# Patient Record
Sex: Male | Born: 1950 | Race: Black or African American | Hispanic: No | Marital: Single | Smoking: Former smoker
Health system: Southern US, Community
[De-identification: ages and names within clinical notes are randomized; demographics above are authoritative.]

---

## 2017-06-15 ENCOUNTER — Encounter (HOSPITAL_BASED_OUTPATIENT_CLINIC_OR_DEPARTMENT_OTHER): Payer: Self-pay | Admitting: Emergency Medicine

## 2017-06-15 ENCOUNTER — Emergency Department (HOSPITAL_BASED_OUTPATIENT_CLINIC_OR_DEPARTMENT_OTHER)
Admission: EM | Admit: 2017-06-15 | Discharge: 2017-06-15 | Disposition: A | Discharge status: Home / Self Care | Payer: Medicare HMO | Attending: Emergency Medicine | Admitting: Emergency Medicine

## 2017-06-15 ENCOUNTER — Other Ambulatory Visit: Payer: Self-pay

## 2017-06-15 ENCOUNTER — Emergency Department (HOSPITAL_BASED_OUTPATIENT_CLINIC_OR_DEPARTMENT_OTHER): Payer: Medicare HMO

## 2017-06-15 DIAGNOSIS — S63681A Other sprain of right thumb, initial encounter: Secondary | ICD-10-CM | POA: Insufficient documentation

## 2017-06-15 DIAGNOSIS — Y999 Unspecified external cause status: Secondary | ICD-10-CM | POA: Diagnosis not present

## 2017-06-15 DIAGNOSIS — S6991XA Unspecified injury of right wrist, hand and finger(s), initial encounter: Secondary | ICD-10-CM | POA: Diagnosis present

## 2017-06-15 DIAGNOSIS — X500XXA Overexertion from strenuous movement or load, initial encounter: Secondary | ICD-10-CM | POA: Diagnosis not present

## 2017-06-15 DIAGNOSIS — Y929 Unspecified place or not applicable: Secondary | ICD-10-CM | POA: Diagnosis not present

## 2017-06-15 DIAGNOSIS — Y9389 Activity, other specified: Secondary | ICD-10-CM | POA: Insufficient documentation

## 2017-06-15 NOTE — ED Provider Notes (Signed)
[facility] EMERGENCY DEPARTMENT Provider Note   CSN: [id number] Arrival date & time: 06/15/17  1208     History   Chief Complaint Chief Complaint  Patient presents with  . Finger Injury    HPI Franklin Johnson is a 67 y.o. male.  67 year old right-handed male who presents with right thumb pain.  Approximately 1 month ago, he was helping a friend with a ladder and injured his right thumb.  He has continued to have pain and swelling since that time.  Thumb feels stiff in the morning, improves throughout the day.  He denies any numbness.  He reports normal functioning of his hand.   The history is provided by the patient.    History reviewed. No pertinent past medical history.  There are no active problems to display for this patient.   PMH: non-contributory PSH: non-contributory   Home Medications    Prior to Admission medications   Not on File    Family History History reviewed. No pertinent family history. Noncontributory  Social History Social History   Tobacco Use  . Smoking status: Not on file  Substance Use Topics  . Alcohol use: Not on file  . Drug use: Not on file  Non contributory   Allergies   Patient has no known allergies.   Review of Systems Review of Systems  Musculoskeletal: Positive for joint swelling.  Skin: Negative for color change.  Neurological: Negative for numbness.      Physical Exam Updated Vital Signs BP (!) 143/90 (BP Location: Left Arm)   Pulse 76   Temp 98.8 F (37.1 C) (Oral)   Resp 18   Ht 5\' 7"  (1.702 m)   Wt 79.4 kg (175 lb)   SpO2 99%   BMI 27.41 kg/m   Physical Exam  Constitutional: He is oriented to person, place, and time. He appears well-developed and well-nourished. No distress.  HENT:  Head: Normocephalic and atraumatic.  Eyes: Conjunctivae are normal.  Neck: Neck supple.  Cardiovascular: Intact distal pulses.  Musculoskeletal: He exhibits edema. He exhibits no tenderness.  Mild  edema of R thumb at MCP and IP joints, normal ROM, no joint laxity at 1st MCP joint  Neurological: He is alert and oriented to person, place, and time. No sensory deficit.  Skin: Skin is warm and dry. Capillary refill takes less than 2 seconds. No rash noted.  Psychiatric: He has a normal mood and affect. Judgment normal.  Nursing note and vitals reviewed.    ED Treatments / Results  Labs (all labs ordered are listed, but only abnormal results are displayed) Labs Reviewed - No data to display  EKG  EKG Interpretation None       Radiology Dg Finger Thumb Right  Result Date: 06/15/2017 CLINICAL DATA:  Right thumb injury 1 month ago. Mechanism unknown Initial encounter. EXAM: RIGHT THUMB 2+V COMPARISON:  None. FINDINGS: No acute bony or joint abnormality is seen. 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 Soft tissues are unremarkable. IMPRESSION: No acute abnormality. Electronically Signed   By: Drusilla Kanner M.D.   On: 06/15/2017 13:13    Procedures Procedures (including critical care time)  Medications Ordered in ED Medications - No data to display   Initial Impression / Assessment and Plan / ED Course  I have reviewed the triage vital signs and the nursing notes.  Pertinent  imaging results that were available during my care of the patient were reviewed  by me and considered in my medical decision making (see chart for details).     Plain films neg for acute fx. Suspect ligamentous sprain.  Gave Velcro thumb spica for comfort and immobilization, discussed follow-up with sports medicine if no improvement with conservative measures.  Final Clinical Impressions(s) / ED Diagnoses   Final diagnoses:  Other sprain of right thumb, initial encounter    ED Discharge Orders    None       Little, Ambrose Finlandachel Morgan, MD 06/15/17 1504

## 2017-06-15 NOTE — ED Triage Notes (Signed)
Patient reports right thumb injury x 1 month.  Reports swelling to right thumb since event occurred.

## 2019-03-08 IMAGING — CR DG FINGER THUMB 2+V*R*
4 series · 4 of 4 positions shown · non-contrast
Comparison: None.

CLINICAL DATA: Right thumb injury 1 month ago. Mechanism unknown
Initial encounter.

EXAM:
RIGHT THUMB 2+V

[x finger pa right (1 of 2)]
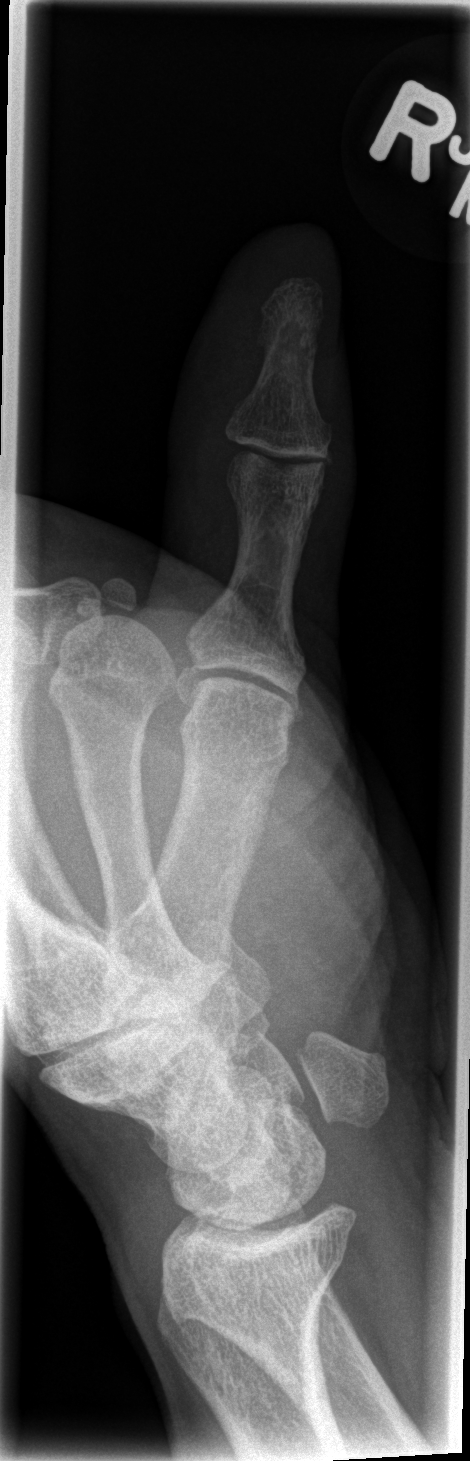

[x finger pa right (2 of 2)]
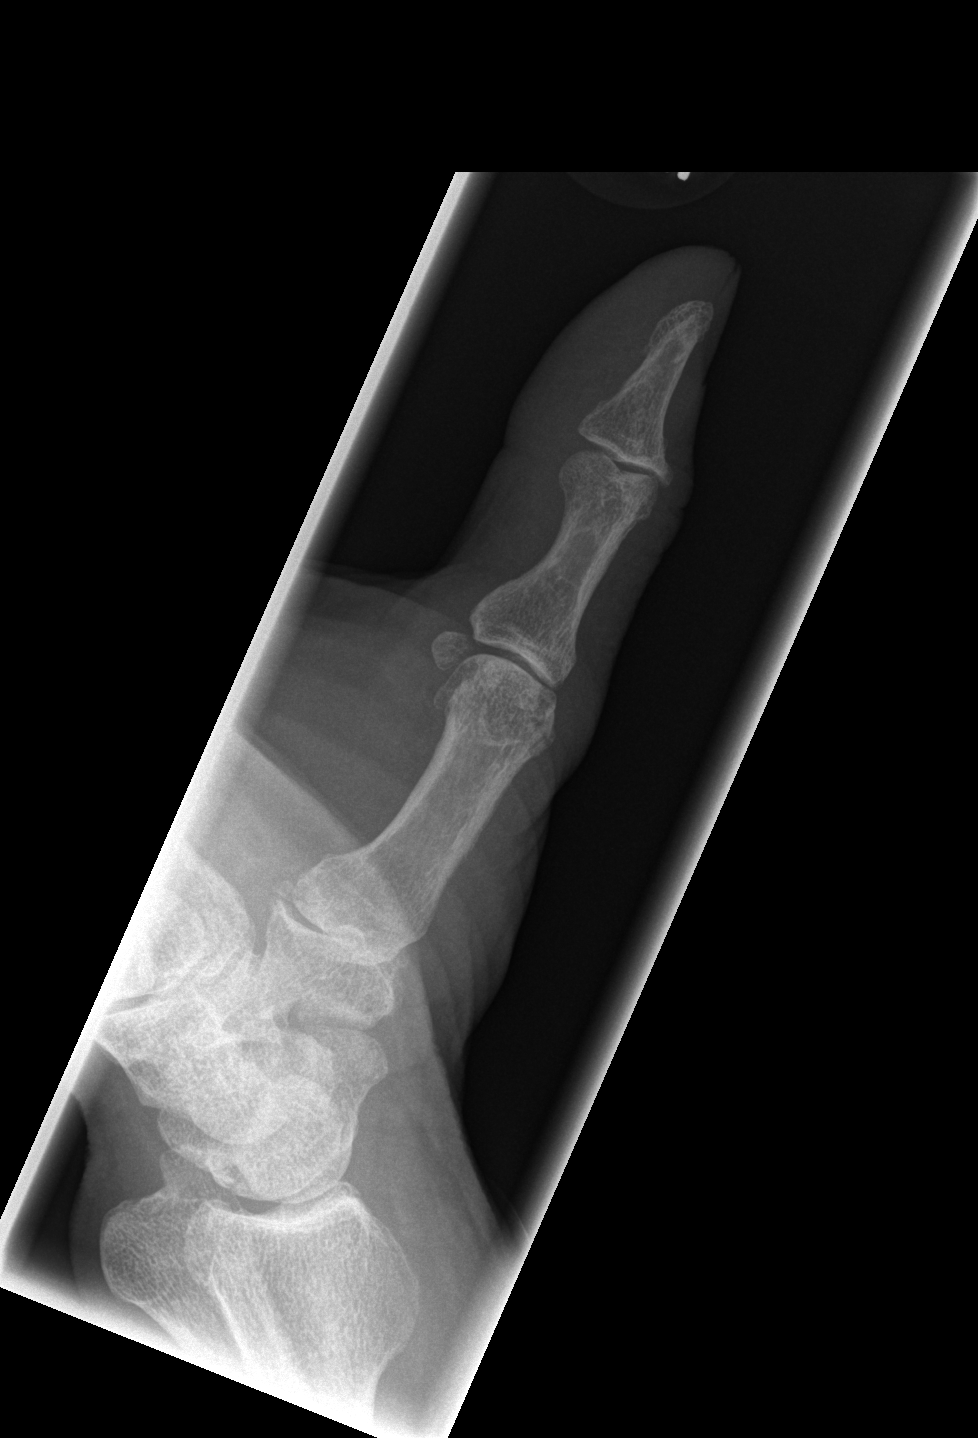

[x finger obl. right]
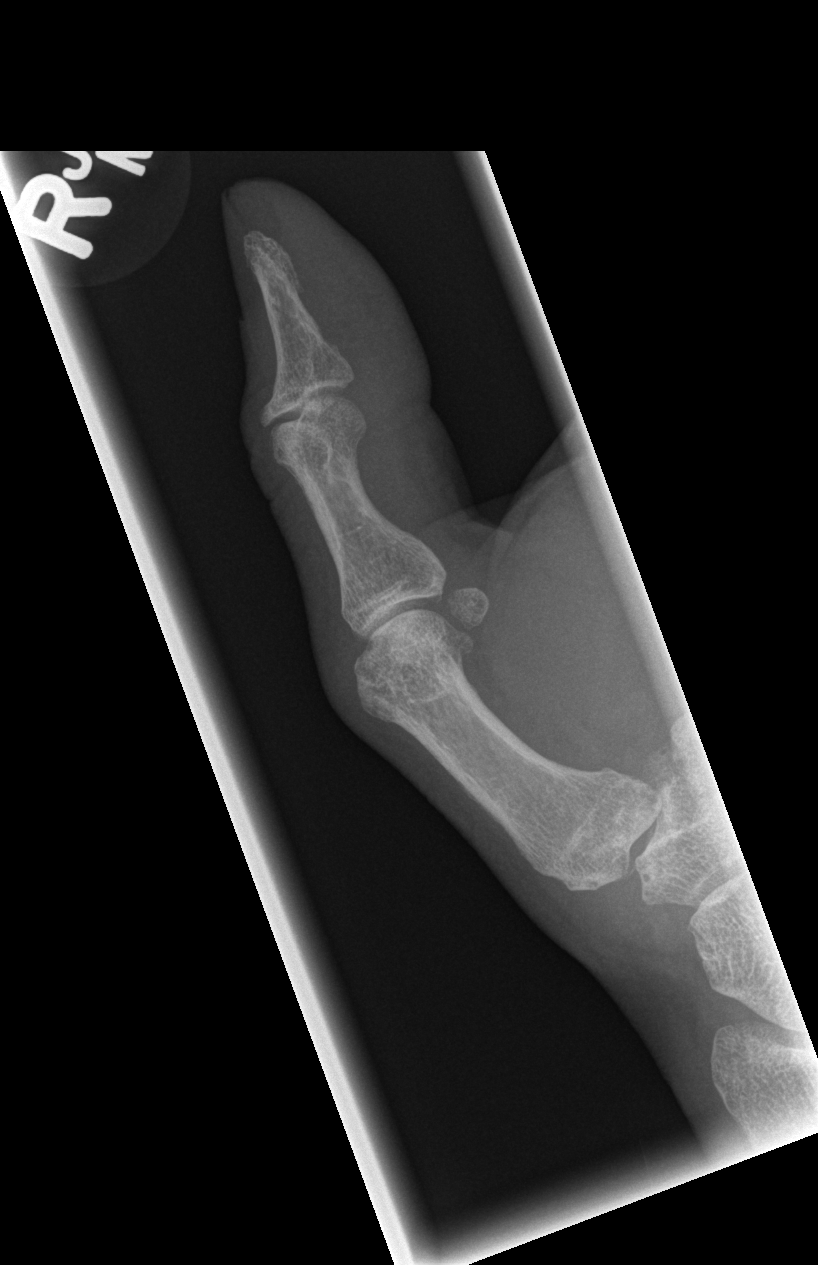

[x finger lateral right]
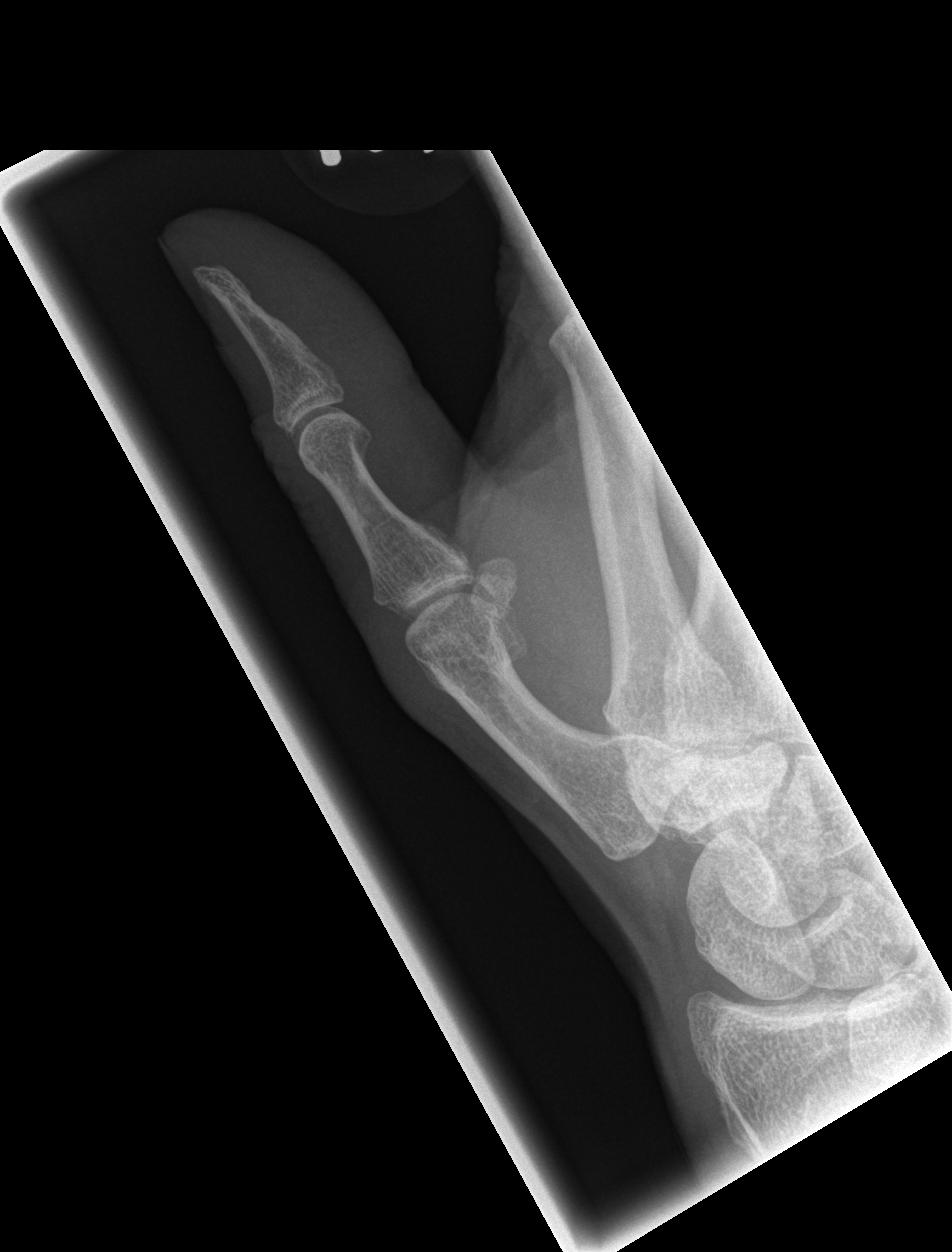

[4 of 4 positions shown; findings below may reference images not displayed]

FINDINGS: No acute bony or joint abnormality is seen. Mild to moderate
degenerative disease at the IP joint of the thumb and first [facility]
joint noted. Soft tissues are unremarkable.
IMPRESSION: No acute abnormality.

## 2020-11-23 ENCOUNTER — Encounter (HOSPITAL_BASED_OUTPATIENT_CLINIC_OR_DEPARTMENT_OTHER): Payer: Self-pay

## 2020-11-23 ENCOUNTER — Other Ambulatory Visit: Payer: Self-pay

## 2020-11-23 ENCOUNTER — Emergency Department (HOSPITAL_BASED_OUTPATIENT_CLINIC_OR_DEPARTMENT_OTHER)
Admission: EM | Admit: 2020-11-23 | Discharge: 2020-11-23 | Disposition: A | Discharge status: Home / Self Care | Payer: Medicare Other | Attending: Emergency Medicine | Admitting: Emergency Medicine

## 2020-11-23 DIAGNOSIS — X500XXA Overexertion from strenuous movement or load, initial encounter: Secondary | ICD-10-CM | POA: Diagnosis not present

## 2020-11-23 DIAGNOSIS — Z87891 Personal history of nicotine dependence: Secondary | ICD-10-CM | POA: Insufficient documentation

## 2020-11-23 DIAGNOSIS — M25519 Pain in unspecified shoulder: Secondary | ICD-10-CM | POA: Diagnosis not present

## 2020-11-23 DIAGNOSIS — S161XXA Strain of muscle, fascia and tendon at neck level, initial encounter: Secondary | ICD-10-CM | POA: Diagnosis not present

## 2020-11-23 DIAGNOSIS — S199XXA Unspecified injury of neck, initial encounter: Secondary | ICD-10-CM | POA: Diagnosis present

## 2020-11-23 DIAGNOSIS — M79603 Pain in arm, unspecified: Secondary | ICD-10-CM | POA: Insufficient documentation

## 2020-11-23 MED ORDER — CYCLOBENZAPRINE HCL 5 MG PO TABS: 5.0000 mg | ORAL_TABLET | Freq: Three times a day (TID) | ORAL | 0 refills | Status: AC | PRN

## 2020-11-23 NOTE — ED Triage Notes (Signed)
Pt c/o right neck tightness/arm pain x 1.5 weeks. Worse with movement. States was digging up a water line in yard and lifting heavy pipe prior to it starting. States sometimes arm gets heavy.  Denies cp/sob

## 2020-11-23 NOTE — Discharge Instructions (Addendum)
You presented with neck stiffness on the right side that is on the shoulder and upper arm. You stated it occurred couple days after yard work and then got worse. The pain improved and then with more yard work got worse again. You pain and history are consistent with muscle strain. I advise you to rest from strenuous work but do exercises of the shoulder and apply Voltaren gel as indicated on the box and use IcyHot with lidocaine as indicated on the box. You stated you have a follow up with PCP on Friday, so they can see how it is doing during that visit. If you develop chest pain, shortness of breath please come to the ED.

## 2020-11-24 NOTE — ED Provider Notes (Signed)
[facility] EMERGENCY DEPARTMENT Provider Note   CSN: [id number] Arrival date & time: 11/23/20  1909     History Chief Complaint  Patient presents with   Neck Pain   Arm Pain    Franklin Johnson is a 70 y.o. male without significant past medical history presenting to the ED with neck, shoulder and arm pain. The pain started about 12 days ago after shoveling and then got better. The patient then trimmed tree branches which brought the pain back. Patient took 800 mg Ibuprofen for 4 days and it helped his pain. He also tried horse liniment which helped his pain. He stated pain was sharp and 7/10 and worse with movement and better with rest.    Neck Pain Associated symptoms: no chest pain and no fever   Arm Pain Pertinent negatives include no chest pain, no abdominal pain and no shortness of breath.      No past medical history on file.  There are no problems to display for this patient.        No family history on file.  Social History   Tobacco Use   Smoking status: Former    Types: Cigarettes   Smokeless tobacco: Never  Vaping Use   Vaping Use: Never used  Substance Use Topics   Alcohol use: Never   Drug use: Never    Home Medications Prior to Admission medications   Medication Sig Start Date End Date Taking? Authorizing Provider  cyclobenzaprine (FLEXERIL) 5 MG tablet Take 1 tablet (5 mg total) by mouth 3 (three) times daily as needed for up to 5 days for muscle spasms. 11/23/20 11/28/20 Yes Gwenevere Abbot, MD    Allergies    Patient has no known allergies.  Review of Systems   Review of Systems  Constitutional:  Negative for chills and fever.  HENT:  Negative for ear pain and sore throat.   Eyes:  Negative for pain and visual disturbance.  Respiratory:  Negative for cough and shortness of breath.   Cardiovascular:  Negative for chest pain and palpitations.  Gastrointestinal:  Negative for abdominal pain and vomiting.  Genitourinary:  Negative for  dysuria and hematuria.  Musculoskeletal:  Positive for back pain (shoulder pain), myalgias and neck pain. Negative for arthralgias.  Skin:  Negative for color change and rash.  Neurological:  Negative for seizures and syncope.  Psychiatric/Behavioral:  Negative for agitation and behavioral problems.   All other systems reviewed and are negative.  Physical Exam Updated Vital Signs BP (!) 140/97 (BP Location: Left Arm)   Pulse 81   Temp 98.2 F (36.8 C) (Oral)   Resp 18   Ht 5\' 6"  (1.676 m)   Wt 83.9 kg   SpO2 98%   BMI 29.86 kg/m   Physical Exam Vitals and nursing note reviewed.  Constitutional:      Appearance: Normal appearance. He is well-developed.  HENT:     Head: Normocephalic and atraumatic.     Right Ear: External ear normal.     Left Ear: External ear normal.     Mouth/Throat:     Mouth: Mucous membranes are moist.     Pharynx: Oropharynx is clear. No posterior oropharyngeal erythema.  Eyes:     Extraocular Movements: Extraocular movements intact.     Conjunctiva/sclera: Conjunctivae normal.     Pupils: Pupils are equal, round, and reactive to light.  Cardiovascular:     Rate and Rhythm: Normal rate and regular rhythm.     Pulses:  Normal pulses.     Heart sounds: Normal heart sounds. No murmur heard. Pulmonary:     Effort: Pulmonary effort is normal. No respiratory distress.     Breath sounds: Normal breath sounds.  Abdominal:     General: Bowel sounds are normal.     Palpations: Abdomen is soft.     Tenderness: There is no abdominal tenderness.  Musculoskeletal:        General: Tenderness (tender upon movement but full passive range of motion. Limited active range of motion due to pain.) present. No swelling, deformity or signs of injury. Normal range of motion.     Cervical back: Normal range of motion and neck supple. Tenderness (tenderness present but improves with palpation. No bony tenderness) present.  Skin:    General: Skin is warm and dry.      Capillary Refill: Capillary refill takes less than 2 seconds.  Neurological:     General: No focal deficit present.     Mental Status: He is alert and oriented to person, place, and time.  Psychiatric:        Mood and Affect: Mood normal.        Behavior: Behavior normal.    ED Results / Procedures / Treatments   Labs (all labs ordered are listed, but only abnormal results are displayed) Labs Reviewed - No data to display  EKG EKG Interpretation  Date/Time:  Monday November 23 2020 19:33:15 EDT Ventricular Rate:  87 PR Interval:  166 QRS Duration: 92 QT Interval:  352 QTC Calculation: 423 R Axis:   37 Text Interpretation: Sinus rhythm with occasional Premature ventricular complexes Otherwise normal ECG No old tracing to compare Confirmed by Linwood Dibbles [id number]) on 11/23/2020 7:37:38 PM  Radiology No results found.  Procedures Procedures   Medications Ordered in ED Medications - No data to display  ED Course  I have reviewed the triage vital signs and the nursing notes.  Pertinent labs & imaging results that were available during my care of the patient were reviewed by me and considered in my medical decision making (see chart for details).    MDM Rules/Calculators/A&P                           Neck/shoulder/arm pain Patient has neck pain after exertional effort. Ddx include muscle strain vs muscle tear vs rotator cuff tear vs neck sprain. Most likely appears to be muscle strain as patient had an inciting event prior to this and it responded well to anti-inflammatory medication and worsened by overuse quickly. Rotator cuff tear is less likely as pain starts around side of the neck and moves towards the shoulder. Cervical sprain is less as patient has no bony tenderness in the neck. No imaging indicated at this time.  Plan: -Advised rest from strenuous work -Neck and shoulder exercises -Flexeril 5 mg TID prn for 5 days -Voltaren gel and IcyHot with Lidocaine -PCP follow up  if no improvement.  Final Clinical Impression(s) / ED Diagnoses Final diagnoses:  Acute strain of neck muscle, initial encounter    Rx / DC Orders ED Discharge Orders          Ordered    cyclobenzaprine (FLEXERIL) 5 MG tablet  3 times daily PRN        11/23/20 2046             Gwenevere Abbot, MD 11/24/20 1707    Linwood Dibbles, MD 11/25/20 1425

## 2023-05-06 ENCOUNTER — Other Ambulatory Visit: Payer: Self-pay

## 2023-05-06 ENCOUNTER — Encounter (HOSPITAL_BASED_OUTPATIENT_CLINIC_OR_DEPARTMENT_OTHER): Payer: Self-pay | Admitting: Urology

## 2023-05-06 ENCOUNTER — Emergency Department (HOSPITAL_BASED_OUTPATIENT_CLINIC_OR_DEPARTMENT_OTHER)
Admission: EM | Admit: 2023-05-06 | Discharge: 2023-05-06 | Disposition: A | Discharge status: Home / Self Care | Payer: Medicare HMO | Attending: Emergency Medicine | Admitting: Emergency Medicine

## 2023-05-06 ENCOUNTER — Emergency Department (HOSPITAL_BASED_OUTPATIENT_CLINIC_OR_DEPARTMENT_OTHER): Payer: Medicare HMO

## 2023-05-06 DIAGNOSIS — W1830XA Fall on same level, unspecified, initial encounter: Secondary | ICD-10-CM | POA: Diagnosis not present

## 2023-05-06 DIAGNOSIS — S4351XA Sprain of right acromioclavicular joint, initial encounter: Secondary | ICD-10-CM | POA: Diagnosis not present

## 2023-05-06 DIAGNOSIS — M25511 Pain in right shoulder: Secondary | ICD-10-CM | POA: Diagnosis present

## 2023-05-06 NOTE — ED Triage Notes (Signed)
 Mass to right upper shoulder noted  States noticed it 2 weeks ago, getting more painful

## 2023-05-06 NOTE — ED Notes (Signed)

## 2023-05-06 NOTE — ED Provider Notes (Signed)
 [facility] EMERGENCY DEPARTMENT AT [facility] Provider Note   CSN: [id number] Arrival date & time: 05/06/23  1544     History  Chief Complaint  Patient presents with   Shoulder Mass     Franklin Johnson is a 73 y.o. male.  Patient here for pain in his right shoulder after fall.  Fall occurred a few weeks ago.  He has been dealing with some pain since the fall with some swelling in the right shoulder.  Nothing makes it worse or better.  Denies any weakness numbness tingling.  Did not hit his head or lose conscious.  Denies any major medical problems.  Not on blood thinners.  The history is provided by the patient.       Home Medications Prior to Admission medications   Not on File      Allergies    Patient has no known allergies.    Review of Systems   Review of Systems  Physical Exam Updated Vital Signs BP (!) 181/119 (BP Location: Left Arm)   Pulse 85   Temp 98.4 F (36.9 C) (Oral)   Resp 17   Ht 5' 6 (1.676 m)   Wt 83.9 kg   SpO2 99%   BMI 29.85 kg/m  Physical Exam Vitals and nursing note reviewed.  Constitutional:      General: He is not in acute distress.    Appearance: He is well-developed.  HENT:     Head: Normocephalic and atraumatic.  Eyes:     Extraocular Movements: Extraocular movements intact.     Conjunctiva/sclera: Conjunctivae normal.     Pupils: Pupils are equal, round, and reactive to light.  Cardiovascular:     Rate and Rhythm: Normal rate and regular rhythm.     Pulses: Normal pulses.     Heart sounds: No murmur heard. Pulmonary:     Effort: Pulmonary effort is normal. No respiratory distress.     Breath sounds: Normal breath sounds.  Abdominal:     Palpations: Abdomen is soft.     Tenderness: There is no abdominal tenderness.  Musculoskeletal:        General: Tenderness present. No swelling.     Cervical back: Neck supple.     Comments: Tenderness with some mild swelling to the right AC joint, range of motion is  intact  Skin:    General: Skin is warm and dry.     Capillary Refill: Capillary refill takes less than 2 seconds.  Neurological:     General: No focal deficit present.     Mental Status: He is alert.     Sensory: No sensory deficit.     Motor: No weakness.  Psychiatric:        Mood and Affect: Mood normal.     ED Results / Procedures / Treatments   Labs (all labs ordered are listed, but only abnormal results are displayed) Labs Reviewed - No data to display  EKG None  Radiology DG Shoulder Right Result Date: 05/06/2023 CLINICAL DATA:  Fall, swelling. EXAM: RIGHT SHOULDER - 3 VIEW COMPARISON:  None Available. FINDINGS: There is no evidence of fracture or dislocation. Acromioclavicular degenerative change with narrowing and osteophyte formation. Focal soft tissue swelling identified superior to the [facility] joint. This could be assessed further with ultrasound or CT if indicated. IMPRESSION: Degenerative changes. Soft tissue swelling. Electronically Signed   By: Fonda Field M.D.   On: 05/06/2023 17:20    Procedures Procedures    Medications Ordered  in ED Medications - No data to display  ED Course/ Medical Decision Making/ A&P                                 Medical Decision Making Amount and/or Complexity of Data Reviewed Radiology: ordered.   Franklin Johnson is here with pain to his right shoulder.  He fell and landed on his right shoulder about 2 weeks ago.  Some pain and swelling since.  Overall differential diagnosis likely AC joint sprain/injury versus less likely fracture or dislocation.  X-ray of the right shoulder showed no fracture or dislocation but did show a little bit of soft tissue swelling.  Overall I do think that this is an [facility] joint sprain and do not think that this is an infectious process and suspect this is an inflammatory process from his fall.  Recommend ice, Tylenol, ibuprofen and rest will have him follow-up with his primary care doctor and will refer  him to orthopedics.  Neurovascular neuromuscular intact.  Discharged in good condition.  This chart was dictated using voice recognition software.  Despite best efforts to proofread,  errors can occur which can change the documentation meaning.         Final Clinical Impression(s) / ED Diagnoses Final diagnoses:  Sprain of right acromioclavicular ligament, initial encounter    Rx / DC Orders ED Discharge Orders     None         Ruthe Cornet, DO 05/06/23 1845

## 2023-05-06 NOTE — Discharge Instructions (Signed)
 Overall I think you bruised/strained your [facility] joint and your right shoulder.  Recommend 1000 mg of Tylenol every 6 hours as needed for pain.  Recommend ice, recommend 400 mg ibuprofen every 8 hours for the next 2 to 3 days as needed as well.  Follow-up with orthopedics.
